# Patient Record
Sex: Female | Born: 1970 | Race: Black or African American | Hispanic: No | Marital: Single | Smoking: Never smoker
Health system: Southern US, Community
[De-identification: ages and names within clinical notes are randomized; demographics above are authoritative.]

## PROBLEM LIST (undated history)

## (undated) DIAGNOSIS — N809 Endometriosis, unspecified: Secondary | ICD-10-CM

## (undated) DIAGNOSIS — K219 Gastro-esophageal reflux disease without esophagitis: Secondary | ICD-10-CM

## (undated) DIAGNOSIS — E7800 Pure hypercholesterolemia, unspecified: Secondary | ICD-10-CM

## (undated) DIAGNOSIS — G43909 Migraine, unspecified, not intractable, without status migrainosus: Secondary | ICD-10-CM

## (undated) HISTORY — PX: OTHER SURGICAL HISTORY: SHX169

## (undated) HISTORY — DX: Pure hypercholesterolemia, unspecified: E78.00

## (undated) HISTORY — PX: TENNIS ELBOW RELEASE/NIRSCHEL PROCEDURE: SHX6651

## (undated) HISTORY — DX: Gastro-esophageal reflux disease without esophagitis: K21.9

## (undated) HISTORY — DX: Migraine, unspecified, not intractable, without status migrainosus: G43.909

## (undated) HISTORY — PX: SHOULDER SURGERY: SHX246

## (undated) HISTORY — PX: CHOLECYSTECTOMY: SHX55

## (undated) HISTORY — PX: NM RENAL LASIX (ARMC HX): HXRAD1213

## (undated) HISTORY — DX: Endometriosis, unspecified: N80.9

## (undated) HISTORY — PX: PARTIAL HYSTERECTOMY: SHX80

---

## 2016-01-17 ENCOUNTER — Other Ambulatory Visit: Payer: Self-pay | Admitting: Family Medicine

## 2016-01-17 ENCOUNTER — Ambulatory Visit
Admission: RE | Admit: 2016-01-17 | Discharge: 2016-01-17 | Disposition: A | Discharge status: Home / Self Care | Payer: BLUE CROSS/BLUE SHIELD | Source: Ambulatory Visit | Attending: Family Medicine | Admitting: Family Medicine

## 2016-01-17 DIAGNOSIS — M25561 Pain in right knee: Principal | ICD-10-CM

## 2016-01-17 DIAGNOSIS — G8929 Other chronic pain: Secondary | ICD-10-CM

## 2016-01-17 DIAGNOSIS — M545 Low back pain: Secondary | ICD-10-CM

## 2016-01-17 DIAGNOSIS — M25562 Pain in left knee: Principal | ICD-10-CM

## 2016-02-29 ENCOUNTER — Encounter (INDEPENDENT_AMBULATORY_CARE_PROVIDER_SITE_OTHER): Payer: Self-pay

## 2016-02-29 ENCOUNTER — Encounter: Payer: Self-pay | Admitting: Podiatry

## 2016-02-29 ENCOUNTER — Ambulatory Visit (INDEPENDENT_AMBULATORY_CARE_PROVIDER_SITE_OTHER): Payer: BLUE CROSS/BLUE SHIELD | Admitting: Podiatry

## 2016-02-29 ENCOUNTER — Ambulatory Visit (HOSPITAL_BASED_OUTPATIENT_CLINIC_OR_DEPARTMENT_OTHER)
Admission: RE | Admit: 2016-02-29 | Discharge: 2016-02-29 | Disposition: A | Discharge status: Home / Self Care | Payer: BLUE CROSS/BLUE SHIELD | Source: Ambulatory Visit | Attending: Podiatry | Admitting: Podiatry

## 2016-02-29 DIAGNOSIS — M722 Plantar fascial fibromatosis: Secondary | ICD-10-CM | POA: Diagnosis not present

## 2016-02-29 DIAGNOSIS — R52 Pain, unspecified: Secondary | ICD-10-CM | POA: Diagnosis not present

## 2016-02-29 DIAGNOSIS — M79671 Pain in right foot: Secondary | ICD-10-CM | POA: Diagnosis present

## 2016-02-29 DIAGNOSIS — M79672 Pain in left foot: Secondary | ICD-10-CM | POA: Diagnosis not present

## 2016-02-29 MED ORDER — METHYLPREDNISOLONE 4 MG PO TBPK: ORAL_TABLET | ORAL | 0 refills | Status: DC

## 2016-02-29 NOTE — Patient Instructions (Signed)

## 2016-02-29 NOTE — Progress Notes (Signed)
   Subjective:    Patient ID: Renee Chandler, female    DOB: [birth year], 45 y.o.   MRN: [id number]  HPI  45 year old female presents the office today for concerns of heel pain to both of her feet with a right side worse in the left and this been ongoing for 8 months. She. She saw another doctor and had 2 injections and tried bracing however her feet continue to get worse. She has pain in the morning which she first gets up and after periods of activity. It does get better with ambulation but she is on pain throughout the day as well. She denies any numbness or tingling. No swelling or redness. No recent injury or trauma. The pain does not wake her up at night. No other complaints.  Review of Systems  All other systems reviewed and are negative.      Objective:   Physical Exam General: AAO x3, NAD  Dermatological: Skin is warm, dry and supple bilateral. Nails x 10 are well manicured; remaining integument appears unremarkable at this time. There are no open sores, no preulcerative lesions, no rash or signs of infection present.  Vascular: Dorsalis Pedis artery and Posterior Tibial artery pedal pulses are 2/4 bilateral with immedate capillary fill time. There is no pain with calf compression, swelling, warmth, erythema.   Neruologic: Grossly intact via light touch bilateral. Vibratory intact via tuning fork bilateral. Protective threshold with Semmes Wienstein monofilament intact to all pedal sites bilateral.   Musculoskeletal:  Tenderness to palpation along the plantar medial tubercle of the calcaneus at the insertion of plantar fascia on the  foot. There is no pain along the course of the plantar fascia within the arch of the foot. Plantar fascia appears to be intact. There is no pain with lateral compression of the calcaneus or pain with vibratory sensation. There is no pain along the course or insertion of the achilles tendon. No other areas of tenderness to bilateral lower extremities. Muscular  strength 5/5 in all groups tested bilateral. Equinus.  Gait: Unassisted, Nonantalgic.      Assessment & Plan:  45 year old female with bilateral heel pain right worse than left. -Treatment options discussed including all alternatives, risks, and complications -Etiology of symptoms were discussed -X-rays were obtained and reviewed with the patient.  -She was told of any further steroid injections. -Discussed stretching icing exercises daily. She has not been doing this. -Discussed custom inserts. She was scanned for orthotics today. -Discussed shoe gear modifications -Prescribed medrol dose pack. Discussed side effects of the medication and directed to stop if any are to occur and call the office.  -Compound cream ordered through [facility] for anti-inflammatory. -Follow-up in 3-4 weeks or sooner if any problems arise. In the meantime, encouraged to call the office with any questions, concerns, change in symptoms.   Ovid CurdMatthew Larhonda Dettloff, DPM

## 2016-03-01 DIAGNOSIS — M722 Plantar fascial fibromatosis: Secondary | ICD-10-CM | POA: Insufficient documentation

## 2016-03-03 ENCOUNTER — Encounter: Payer: Self-pay | Admitting: Podiatry

## 2016-03-08 ENCOUNTER — Encounter: Payer: Self-pay | Admitting: Podiatry

## 2016-03-08 ENCOUNTER — Telehealth: Payer: Self-pay | Admitting: *Deleted

## 2016-03-08 MED ORDER — NONFORMULARY OR COMPOUNDED ITEM
2 refills | Status: DC
Start: 2016-03-08 — End: 2023-01-01

## 2016-03-08 NOTE — Telephone Encounter (Signed)
Pt requested status of cream Dr. Ardelle AntonWagoner ordered. I reviewed pt's record 02/29/2016, Dr. Ardelle AntonWagoner ordered [facility] Antiinflammatory cream, but rx was not given to assistant to fax. Informed pt by email the [facility] would contact with insurance information and delivery information. Faxed orders to [facility].

## 2016-03-21 ENCOUNTER — Encounter: Payer: Self-pay | Admitting: Podiatry

## 2016-03-24 ENCOUNTER — Ambulatory Visit (INDEPENDENT_AMBULATORY_CARE_PROVIDER_SITE_OTHER): Payer: BLUE CROSS/BLUE SHIELD | Admitting: Podiatry

## 2016-03-24 ENCOUNTER — Encounter: Payer: Self-pay | Admitting: Podiatry

## 2016-03-24 DIAGNOSIS — M722 Plantar fascial fibromatosis: Secondary | ICD-10-CM

## 2016-03-28 ENCOUNTER — Encounter: Payer: Self-pay | Admitting: Podiatry

## 2016-03-30 ENCOUNTER — Encounter: Payer: Self-pay | Admitting: Podiatry

## 2016-03-30 NOTE — Progress Notes (Signed)
Subjective: 45 year old female presents the office today for concerns of pain to the right heel into the arch. She said left side is doing well. She has continue with stretching, icing exercises daily. She is also continue with the plantar fascial brace and she completed a Medrol Dosepak. Her pain overall is improved however the right side does continue. She also presents a pickup orthotics. Denies any systemic complaints such as fevers, chills, nausea, vomiting. No acute changes since last appointment, and no other complaints at this time.   Objective: AAO x3, NAD DP/PT pulses palpable bilaterally, CRT less than 3 seconds There is improved but continued tenderness to palpation along the plantar medial tubercle of the calcaneus at the insertion of plantar fascia on the right foot. There is no pain to the left foot today. There is no pain along the course of the plantar fascia within the arch of the foot. Plantar fascia appears to be intact. There is no pain with lateral compression of the calcaneus or pain with vibratory sensation. There is no pain along the course or insertion of the achilles tendon. No other areas of tenderness to bilateral lower extremities. No open lesions or pre-ulcerative lesions.  No pain with calf compression, swelling, warmth, erythema  Assessment: Right foot plantar fasciitis  Plan: -All treatment options discussed with the patient including all alternatives, risks, complications.  -Patient elects to proceed with steroid injection into the right heel. Under sterile skin preparation, a total of 2.5cc of kenalog 10, 0.5% Marcaine plain, and 2% lidocaine plain were infiltrated into the symptomatic area without complication. A band-aid was applied. Patient tolerated the injection well without complication. Post-injection care with discussed with the patient. Discussed with the patient to ice the area over the next couple of days to help prevent a steroid flare.  -Orthotics were  dispensed today. Oral and written break in instructions were discussed -Continue stretching, icing exercises daily as well as supportive shoe gear. -Patient encouraged to call the office with any questions, concerns, change in symptoms.   Ovid CurdMatthew Wagoner, DPM

## 2016-04-21 ENCOUNTER — Ambulatory Visit: Payer: BLUE CROSS/BLUE SHIELD | Admitting: Podiatry

## 2016-05-22 ENCOUNTER — Ambulatory Visit (INDEPENDENT_AMBULATORY_CARE_PROVIDER_SITE_OTHER): Payer: BLUE CROSS/BLUE SHIELD | Admitting: Podiatry

## 2016-05-22 ENCOUNTER — Encounter: Payer: Self-pay | Admitting: Podiatry

## 2016-05-22 VITALS — BP 107/69 | HR 107 | Resp 16

## 2016-05-22 DIAGNOSIS — M722 Plantar fascial fibromatosis: Secondary | ICD-10-CM | POA: Diagnosis not present

## 2016-05-22 DIAGNOSIS — M629 Disorder of muscle, unspecified: Secondary | ICD-10-CM | POA: Diagnosis not present

## 2016-05-23 ENCOUNTER — Telehealth: Payer: Self-pay | Admitting: *Deleted

## 2016-05-23 DIAGNOSIS — M629 Disorder of muscle, unspecified: Secondary | ICD-10-CM

## 2016-05-23 DIAGNOSIS — M722 Plantar fascial fibromatosis: Secondary | ICD-10-CM

## 2016-05-23 NOTE — Progress Notes (Signed)
Subjective: 46 year old female presents the op city. Evaluation of right foot pain, plantar fasciitis. This is been ongoing for greater than one year and this point. She says that she still has pain on a throbbing discomfort on the bottom of the heel. She denies any numbness or tingling or any sharp pains. She states that she is continuing stretching, icing as well as when the plantar fascial brace. She is also been wearing orthotics are helping. She would like to discuss further treatment options today. Denies any systemic complaints such as fevers, chills, nausea, vomiting. No acute changes since last appointment, and no other complaints at this time.   Objective: AAO x3, NAD DP/PT pulses palpable bilaterally, CRT less than 3 seconds Negative tinel sign There is continued tenderness to palpation along the plantar medial tubercle of the calcaneus at the insertion of plantar fascia on the right foot. There is no pain along the course of the plantar fascia within the arch of the foot. Plantar fascia appears to be intact. There is no pain with lateral compression of the calcaneus or pain with vibratory sensation. There is no pain along the course or insertion of the achilles tendon. No other areas of tenderness to bilateral lower extremities. No open lesions or pre-ulcerative lesions.  No pain with calf compression, swelling, warmth, erythema  Assessment: Chronic right heel pain; plantar fasciitis.   Plan: -All treatment options discussed with the patient including all alternatives, risks, complications.  -Previous x-ray in chart were reviewed with the patient. I did send her orthotics back to help increase the medial arch height is seen for saw help. I discussed further treatment options with the patient requested plantar fasciitis. Discussed MRI, physical therapy, surgery, EPAT. After discussion of this point we will order an MRI to rule out any other pathology, plantar fascial tearing. -Follow-up  after MRI or sooner if needed. -Patient encouraged to call the office with any questions, concerns, change in symptoms.   Ovid CurdMatthew Wagoner, DPM

## 2016-05-23 NOTE — Telephone Encounter (Addendum)
-----   Message from Vivi BarrackMatthew R Wagoner, DPM sent at 05/23/2016  7:33 AM EST ----- Can you please order an MRI of the right foot to rule out plantar fascial tear. Thanks. [facility], and gave orders to [facility] [place] for [employer]. 06/02/2016-Communications email with pt's after she was hospitalized for 5 days. Pt states she is unable to afford MRI. Dr. Ardelle AntonWagoner states order PT. Informed pt by email of orders and [employer] [phone] to call for appt.

## 2016-05-24 ENCOUNTER — Telehealth: Payer: Self-pay | Admitting: *Deleted

## 2016-05-24 NOTE — Telephone Encounter (Signed)
Sent the inserts back to [facility] to increase the arch areas. Misty StanleyLisa

## 2016-05-26 ENCOUNTER — Telehealth: Payer: Self-pay | Admitting: *Deleted

## 2016-05-26 NOTE — Telephone Encounter (Signed)
"  Her MRI w/out contrast right foot requires authorization from [employer]."

## 2016-05-26 NOTE — Telephone Encounter (Signed)
I called and informed Renee Chandler that MRI was authorized for 05/29/2016.  The authorization number is [id number] and it is valid from 05/26/2016 to 06/24/2016.

## 2016-05-29 ENCOUNTER — Other Ambulatory Visit: Payer: BLUE CROSS/BLUE SHIELD

## 2016-06-01 ENCOUNTER — Encounter: Payer: Self-pay | Admitting: Podiatry

## 2016-06-02 ENCOUNTER — Other Ambulatory Visit: Payer: BLUE CROSS/BLUE SHIELD

## 2016-06-14 ENCOUNTER — Encounter: Payer: Self-pay | Admitting: Podiatry

## 2016-06-16 ENCOUNTER — Ambulatory Visit: Payer: BLUE CROSS/BLUE SHIELD

## 2016-06-16 DIAGNOSIS — M722 Plantar fascial fibromatosis: Secondary | ICD-10-CM

## 2016-06-16 NOTE — Patient Instructions (Signed)

## 2016-06-19 NOTE — Progress Notes (Signed)
Patient presents for orthotic pick up.  Verbal and written break in and wear instructions given.  Patient will follow up in 4 weeks if symptoms worsen or fail to improve. 

## 2017-03-08 ENCOUNTER — Ambulatory Visit (INDEPENDENT_AMBULATORY_CARE_PROVIDER_SITE_OTHER): Payer: BLUE CROSS/BLUE SHIELD | Admitting: Podiatry

## 2017-03-08 ENCOUNTER — Encounter: Payer: Self-pay | Admitting: Podiatry

## 2017-03-08 DIAGNOSIS — M722 Plantar fascial fibromatosis: Secondary | ICD-10-CM | POA: Diagnosis not present

## 2017-03-08 DIAGNOSIS — M629 Disorder of muscle, unspecified: Secondary | ICD-10-CM

## 2017-03-11 NOTE — Progress Notes (Signed)
Subjective: Ms. Durwin NoraDixon presents the office today for follow-up evaluation of bilateral heel pain with left side worse than the right.  This is been ongoing at this point for over a year and I have seen her for some time for this.  She has continued numerous conservative treatments including shoe changes, offloading, padding, inserts without any significant improvement.  At this point like to go ahead and get an MRI done for possible plan for surgical intervention or other treatments.  She denies any numbness or tingling.  The pain does not wake her up at night.  She has no other concerns. Denies any systemic complaints such as fevers, chills, nausea, vomiting. No acute changes since last appointment, and no other complaints at this time.   Objective: AAO x3, NAD DP/PT pulses palpable bilaterally, CRT less than 3 seconds Negative Tinel's sign bilaterally Tenderness to palpation along the plantar medial tubercle of the calcaneus at the insertion of plantar fascia on the left greater than right foot. There is no pain along the course of the plantar fascia within the arch of the foot. Plantar fascia appears to be intact. There is no pain with lateral compression of the calcaneus or pain with vibratory sensation. There is no pain along the course or insertion of the achilles tendon. No other areas of tenderness to bilateral lower extremities. No open lesions or pre-ulcerative lesions.  No pain with calf compression, swelling, warmth, erythema  Assessment: Bilateral heel pain left side worse than right which is chronic  Plan: -All treatment options discussed with the patient including all alternatives, risks, complications.  -At this point we discussed numerous conservative and surgical treatment options.  We will plan on getting an MRI to rule out a plantar fascial tear or other pathology was for potential surgical planning.  MRI of bilateral feet were ordered.  For now continue conservative treatment but  she wished to hold off on injections at this point. -Patient encouraged to call the office with any questions, concerns, change in symptoms.    Vivi BarrackMatthew R Wagoner DPM

## 2017-03-12 ENCOUNTER — Telehealth: Payer: Self-pay | Admitting: *Deleted

## 2017-03-12 DIAGNOSIS — M629 Disorder of muscle, unspecified: Secondary | ICD-10-CM

## 2017-03-12 DIAGNOSIS — M722 Plantar fascial fibromatosis: Secondary | ICD-10-CM

## 2017-03-12 NOTE — Telephone Encounter (Signed)
Orders to J. Quintana, RN for pre-cert and faxed to [facility]. 

## 2017-03-12 NOTE — Telephone Encounter (Signed)
-----   Message from Vivi BarrackMatthew R Wagoner, DPM sent at 03/11/2017  9:36 AM EST ----- Please order bilateral MRIs sharp plantar fascial tear?  Thank you

## 2017-03-20 ENCOUNTER — Telehealth: Payer: Self-pay | Admitting: *Deleted

## 2017-03-20 NOTE — Telephone Encounter (Addendum)
Pt states she was to be schedule for MRIs, but no one has called. Left message informing pt of [facility] [phone] to schedule at her convenience.

## 2017-03-21 ENCOUNTER — Encounter: Payer: Self-pay | Admitting: Podiatry

## 2017-03-21 ENCOUNTER — Telehealth: Payer: Self-pay | Admitting: Podiatry

## 2017-03-21 DIAGNOSIS — M722 Plantar fascial fibromatosis: Secondary | ICD-10-CM

## 2017-03-21 DIAGNOSIS — M629 Disorder of muscle, unspecified: Secondary | ICD-10-CM

## 2017-03-21 NOTE — Telephone Encounter (Signed)
[facility] me a message yesterday in regards to an MRI being scheduled. I need to speak to her because it was sent to the wrong radiology place because [place]. When I saw Dr. Ardelle AntonWagoner he stated he would have it scheduled somewhere in [place] because its closer for me and he would still be able to see it online. If someone would please call me back and I would greatly appreciate it. My number is [phone]. Thank you.

## 2017-03-21 NOTE — Telephone Encounter (Signed)
I called [employer] to check status of PEER TO PEER and was transferred to [facility] [facility] and was told the case was closed, but I could leave a message to receive a call for Provider Courtesy Review. I asked for the phone number to call and was told there was not phone number, that I should leave a message to have someone call back. I listened to the message and was also given a fax [phone] to fax clinicals ATTN:  Appeals. I spoke with Tomasa HoseJ. Quintana, RN, she stated send fax information and get information for DR Ardelle AntonWagoner to speak. with Provider Courtesy Review. Faxed Cover Sheet with ATTN:  Appeals, clinicals, orders and demographics [phone].

## 2017-03-21 NOTE — Telephone Encounter (Signed)
I spoke with pt and informed her that the order would be changed once the PEER TO PEER had been completed. Pt asked what a PEER TO PEER was and I explained that it was went Dr.Wagoner spoke with a insurance company doctor to discuss the need for the MRI. Pt asked how long it would take to get a PEER TO PEER, she wanted to have the MRIs prior to the end of the year. Pt asked why [facility] had called her to schedule and I told her she could schedule before it was prior authorized. Pt stated I was not understanding what she was asking and then asked if it took 2 weeks to get the PEER TO PEER, like it had been. I told pt that the we had Tomasa HoseJ. Quintana, RN working on it and we had the long weekend for Thanksgiving, so it did not always take so long. Pt stated I was not understanding what she was saying and kept "overtalking" her, so she would just wait until the PEER TO PEER. I told pt to ask again that I was listening and she hung up.

## 2017-03-22 NOTE — Telephone Encounter (Signed)
[employer] - Renee Chandler Provider Courtesy Review has been started and can not be stopped, her information says the determination will be made today, if it denied another case/orders can not be started within 60 days.

## 2017-03-22 NOTE — Telephone Encounter (Signed)
Dr. Ardelle AntonWagoner states cancel the orders of 03/11/2017 and [facility]order at [facility]. Orders to Tomasa HoseJ. Quintana, RN and [facility].

## 2017-03-22 NOTE — Addendum Note (Signed)
Addended by: Alphia Kava'CONNELL, Lynette Topete D on: 03/22/2017 10:10 AM   Modules accepted: Orders

## 2017-03-23 ENCOUNTER — Telehealth: Payer: Self-pay | Admitting: *Deleted

## 2017-03-23 NOTE — Telephone Encounter (Signed)
"  MRI was not approved after reading patient's clinical notes.  AIM review process has been completed.  This case cannot be re-opened.  If the doctor wants to start another case it has to be after 60 days.  The patient can appeal if she likes within the 60 day timeframe.  An official letter will be sent to the patient as well as the doctor.  Give me a call if you have any questions."

## 2017-03-23 NOTE — Telephone Encounter (Signed)
[employer] Renee Chandler states 10:30am 03/22/2017 MRI [id number] for right and left feet were denied due to did not meet the criteria. I asked [employer] Renee Chandler to fax the statements to [phone].

## 2017-03-23 NOTE — Telephone Encounter (Signed)
Please let her know and to start the appeal if she wishes.

## 2017-03-23 NOTE — Telephone Encounter (Signed)
What is the criteria that is not met?

## 2017-03-26 ENCOUNTER — Encounter: Payer: Self-pay | Admitting: Podiatry

## 2017-03-26 NOTE — Telephone Encounter (Signed)
Spoke with patient informing her of the denial of her MRI and reasons why. Informed her of her ability to appeal and offered information on how to do an appeal. She stated that she has this information already and that she would write an appeal letter and file with A[employer]. I told her if she needed anything from our office to let us know.

## 2017-04-04 ENCOUNTER — Encounter: Payer: Self-pay | Admitting: Podiatry

## 2017-04-09 ENCOUNTER — Telehealth: Payer: Self-pay | Admitting: *Deleted

## 2017-04-09 DIAGNOSIS — M79672 Pain in left foot: Secondary | ICD-10-CM

## 2017-04-09 NOTE — Telephone Encounter (Signed)
Dr. Ardelle AntonWagoner ordered [facility].

## 2017-04-09 NOTE — Telephone Encounter (Addendum)
[employer] - [place] APPROVED MRI LEFT [id number] HEEL/FOOT ORDER# [id number], VALID 04/09/2017 - 01/152019. [facility] to [facility]. Emailed pt MRI of Heel/Foot has been approved for left.

## 2017-04-12 NOTE — Telephone Encounter (Signed)
Entered in error

## 2017-04-20 ENCOUNTER — Ambulatory Visit
Admission: RE | Admit: 2017-04-20 | Discharge: 2017-04-20 | Disposition: A | Discharge status: Home / Self Care | Payer: BLUE CROSS/BLUE SHIELD | Source: Ambulatory Visit | Attending: Podiatry | Admitting: Podiatry

## 2017-04-20 ENCOUNTER — Encounter: Payer: Self-pay | Admitting: Podiatry

## 2017-04-20 DIAGNOSIS — R600 Localized edema: Secondary | ICD-10-CM | POA: Insufficient documentation

## 2017-04-20 DIAGNOSIS — M79672 Pain in left foot: Secondary | ICD-10-CM | POA: Diagnosis present

## 2017-04-23 ENCOUNTER — Telehealth: Payer: Self-pay | Admitting: *Deleted

## 2017-04-23 DIAGNOSIS — M722 Plantar fascial fibromatosis: Secondary | ICD-10-CM

## 2017-04-23 NOTE — Telephone Encounter (Signed)
Left message informing pt I would call on her alternate number to discuss the treatment options recommended by Dr. Ardelle AntonWagoner. I informed pt of Dr. Gabriel RungWagoner's recommendation for EPAT $600.00 x 4 visits, 1 weekly x3 weeks then one visit 30 days later, payment plan $150.00/visit, and PT or office visit to discuss other options to include injections. Pt states she would like the EPAT, but it is too expensive at this time, so would like to try [facility] at [facility]. Robet Leu GOOGLED [facility] [address], [place], [place] [address], [phone]. I called [facility] and they will fax the referral form.

## 2017-04-25 NOTE — Telephone Encounter (Signed)
Faxed required form to [facility] for evaluation and treatment of B/L plantar fasciitis.

## 2017-04-25 NOTE — Telephone Encounter (Signed)
Thanks

## 2018-09-15 IMAGING — MR MR HEEL *L* W/O CM
6 series · 40 of 40 positions shown · non-contrast
Comparison: radiographs dated 02/29/2016

CLINICAL DATA: Chronic progressive left foot pain.

EXAM:
MR OF THE LEFT HEEL WITHOUT CONTRAST
TECHNIQUE: Multiplanar, multisequence MR imaging of the left hindfoot was
performed. No intravenous contrast was administered.

[Series 3: PD fat-sat · axial · 3.0mm · 0.56mm/px · z∈[-44,+60]mm · 9 of 30 slices shown]
[im 1/30]
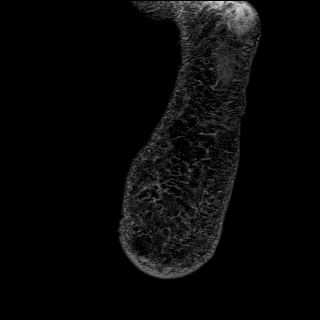
[im 4/30]
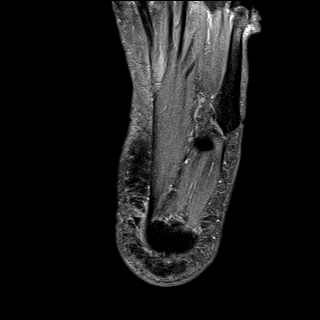
[im 8/30]
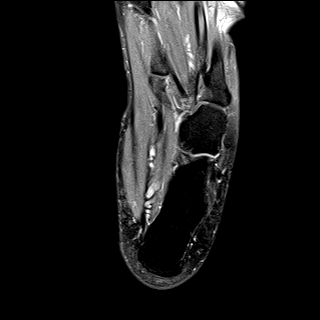
[im 11/30]
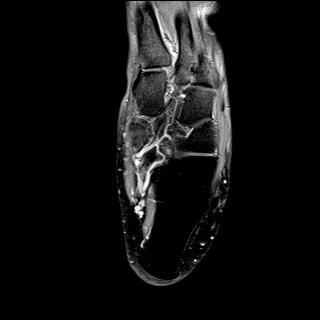
[im 15/30]
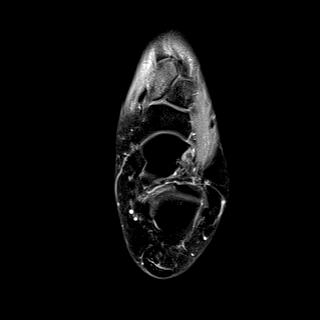
[im 19/30]
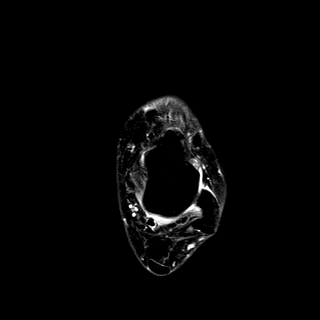
[im 22/30]
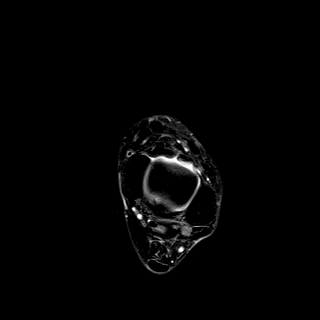
[im 26/30]
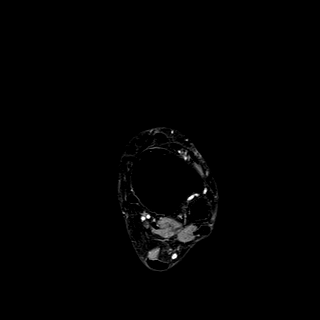
[im 30/30]
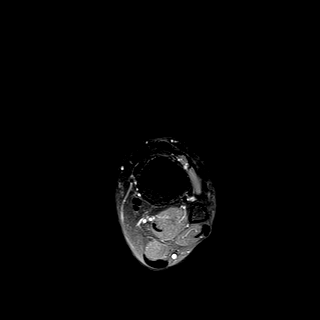

[Series 4: T2 fat-sat · axial · 3.0mm · 0.56mm/px · z∈[-44,+60]mm · 8 of 30 slices shown (1 of 3)]
[im 1/30]
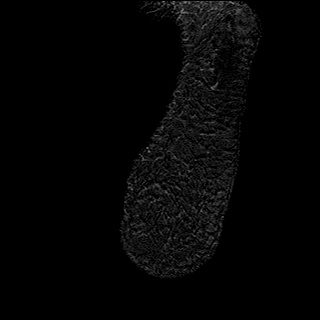
[im 5/30]
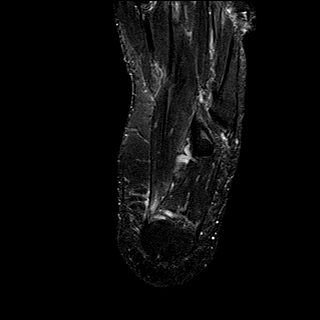
[im 9/30]
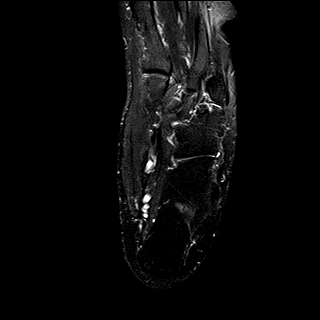
[im 13/30]
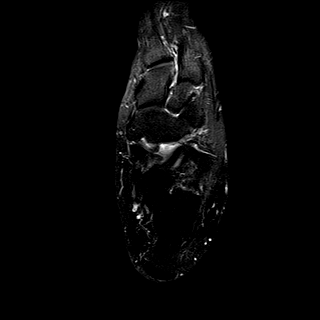
[im 17/30]
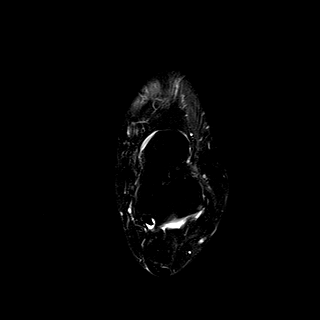
[im 21/30]
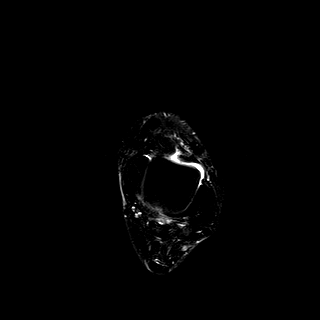
[im 25/30]
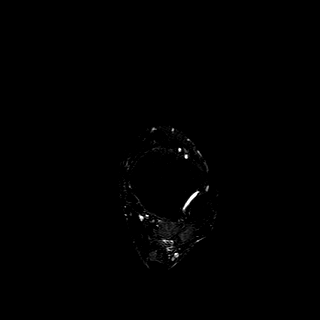
[im 30/30]
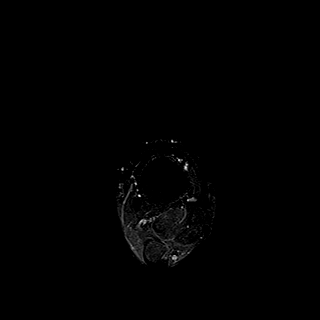

[Series 5: T2 fat-sat · coronal · 3.0mm · 0.56mm/px · 8 of 31 slices shown (2 of 3)]
[im 1/31]
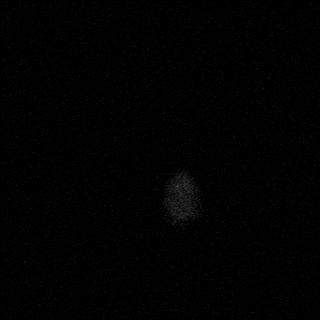
[im 5/31]
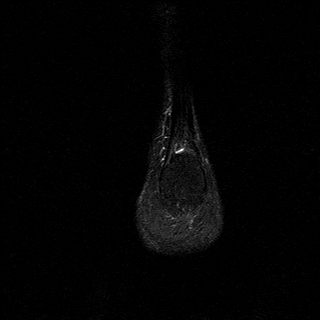
[im 9/31]
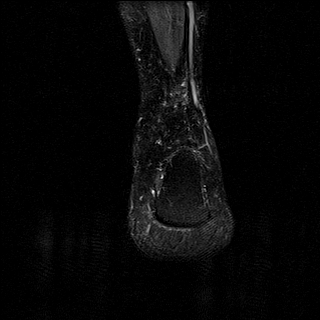
[im 13/31]
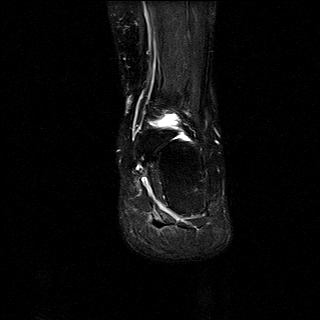
[im 18/31]
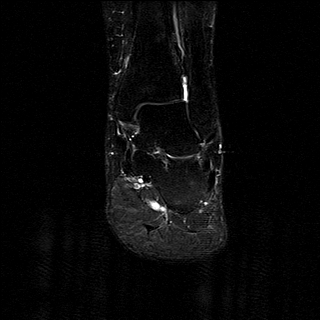
[im 22/31]
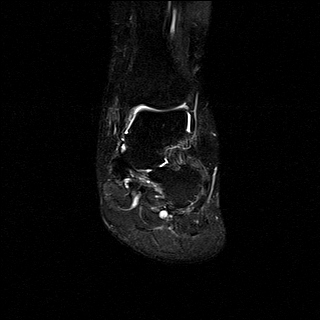
[im 26/31]
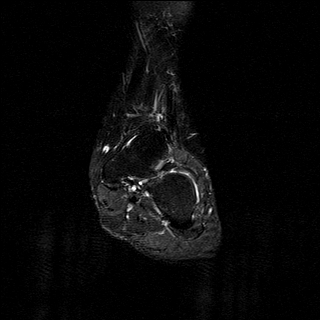
[im 31/31]
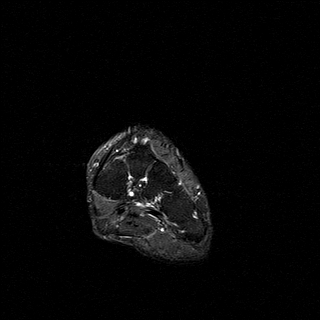

[Series 6: T1 · sagittal · 3.0mm · 0.56mm/px · 5 of 21 slices shown]
[im 1/21]
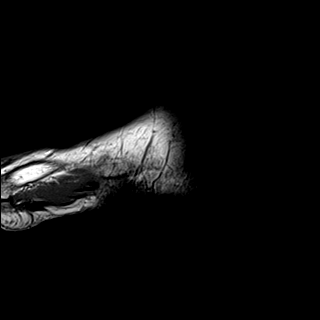
[im 6/21]
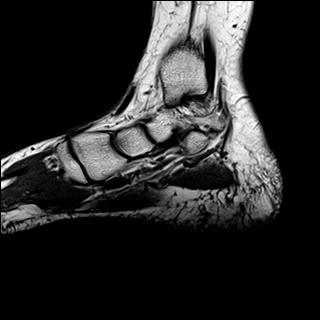
[im 11/21]
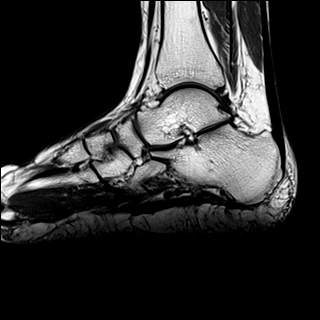
[im 16/21]
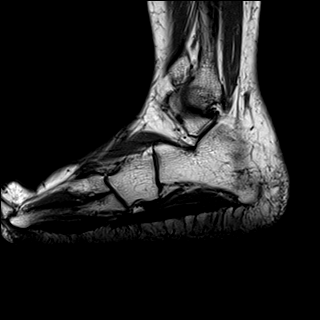
[im 21/21]
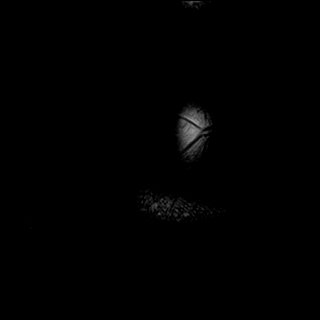

[Series 7: T2 fat-sat · sagittal · 3.0mm · 0.70mm/px · 5 of 21 slices shown (3 of 3)]
[im 1/21]
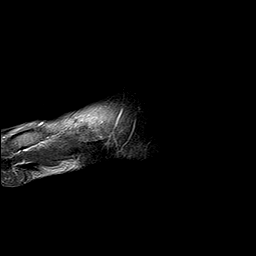
[im 6/21]
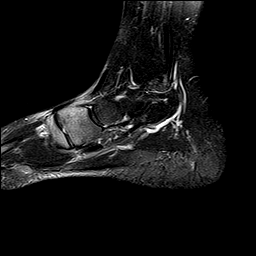
[im 11/21]
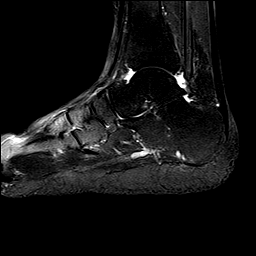
[im 16/21]
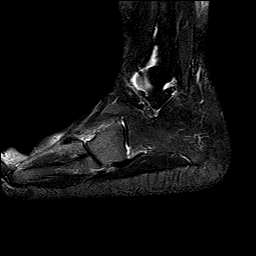
[im 21/21]
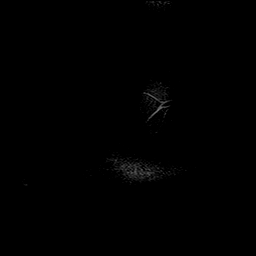

[Series 8: STIR · sagittal · 3.0mm · 0.35mm/px · 5 of 21 slices shown]
[im 1/21]
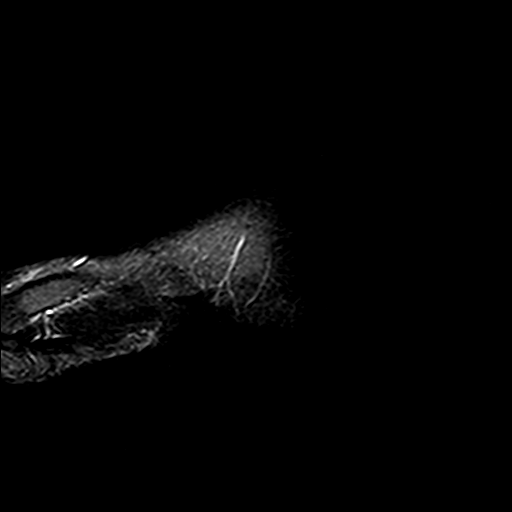
[im 6/21]
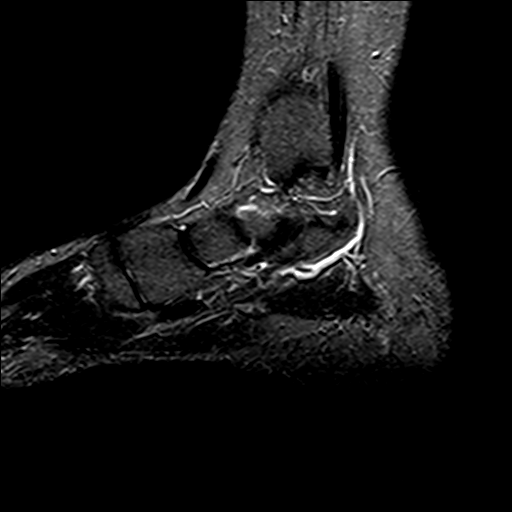
[im 11/21]
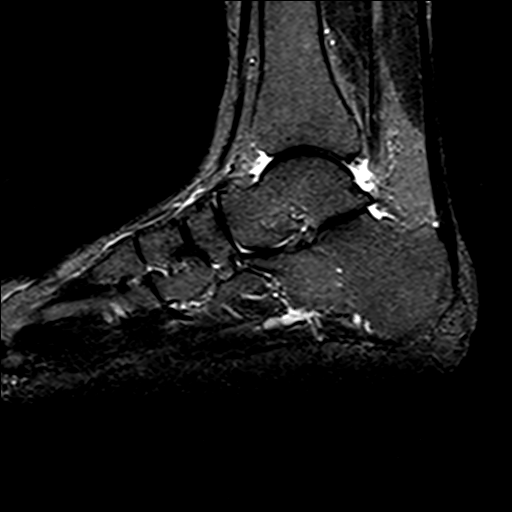
[im 16/21]
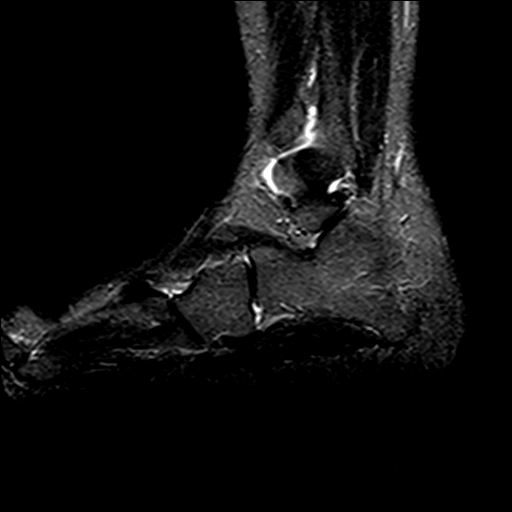
[im 21/21]
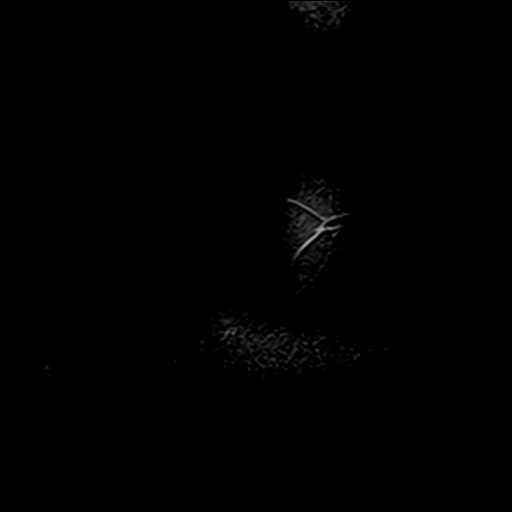

[40 of 40 positions shown; findings below may reference images not displayed]

FINDINGS: TENDONS

Peroneal: Normal.

Posteromedial: Normal.

Anterior: Normal.

Achilles: Normal.

Plantar Fascia: There is a small focal area of edema around the
proximal medial band of the plantar fascia including slight edema in
the proximal flexor digitorum brevis muscle. There is no hypertrophy
of the medial band. There is no edema in the adjacent calcaneus.
There is no distal muscle edema.

LIGAMENTS

Lateral: Normal.

Medial: Normal.

CARTILAGE

Ankle Joint: Normal.

Subtalar Joints/Sinus Tarsi: Normal.

Bones: Normal.

Other: None
IMPRESSION: Small focal area of edema surrounding the proximal medial band of
the plantar fascia including slight edema in the origin of the
flexor digitorum brevis muscle. Findings are consistent with focal
plantar fasciitis.

## 2023-01-01 ENCOUNTER — Encounter: Payer: Self-pay | Admitting: Psychiatry

## 2023-01-01 ENCOUNTER — Ambulatory Visit: Payer: BC Managed Care – PPO | Admitting: Psychiatry

## 2023-01-01 VITALS — BP 126/64 | Ht 64.0 in | Wt 169.0 lb

## 2023-01-01 DIAGNOSIS — G43E19 Chronic migraine with aura, intractable, without status migrainosus: Secondary | ICD-10-CM | POA: Diagnosis not present

## 2023-01-01 MED ORDER — EMGALITY 120 MG/ML ~~LOC~~ SOAJ: 1.0000 | SUBCUTANEOUS | 3 refills | Status: DC

## 2023-01-01 MED ORDER — EMGALITY 120 MG/ML ~~LOC~~ SOAJ: 2.0000 | Freq: Once | SUBCUTANEOUS | 0 refills | Status: AC

## 2023-01-01 MED ORDER — RIZATRIPTAN BENZOATE 10 MG PO TABS: 10.0000 mg | ORAL_TABLET | ORAL | 6 refills | Status: AC | PRN

## 2023-01-01 NOTE — Progress Notes (Signed)
Referring:  [name], FNP [address] [place],  [place] [address]  PCP: Pcp, No  Neurology was asked to evaluate Renee Chandler, a 52 year old female for a chief complaint of headaches.  Our recommendations of care will be communicated by shared medical record.    CC:  headaches  History provided from self  HPI:  Medical co-morbidities: s/p gastric bypass  The patient presents for evaluation of headaches which began when she was a teenager. Migraines are associated with photophobia and nausea. She is currently averaging 10-15 migraines per month. Takes Excedrin as needed for her more mild migraines and the rizatriptan for her more severe migraines. Maxalt makes her feel a "numbing sensation" when she takes it, but she does not find that particularly bothersome. Takes Verapamil 180 mg daily and Ajovy 225 monthly for migraine prevention. This combination had worked relatively well until about 1.5 months ago when it started to lose efficacy.  Headache History: Onset: 1983 Triggers: heat, fasting, strong smells Aura: spots in vision Quality/Description: throbbing Associated Symptoms:  Photophobia: yes  Phonophobia: no  Nausea: sometimes Worse with activity?: yes Duration of headaches:  Migraine days per month: 15 Headache free days per month: 15  Current Treatment: Abortive Maxalt 10 mg PRN  Preventative Ajovy 225 mg monthly Verapamil 180 mg daily  Prior Therapies                                 Prevention: Verapamil 180 mg daily Propranolol Topamax/Trokendi Gabapentin Amitriptyline Cymbalta 20 mg every other day Ajovy Botox  Rescue: Maxalt 10 mg PRN Zomig Imitrex Relpax Axert Naratriptan Ubrelvy   LABS: N/a  IMAGING:  MRI brain several years ago reportedly normal per patient   Current Outpatient Medications on File Prior to Visit  Medication Sig Dispense Refill   Docusate Sodium (DSS) 100 MG CAPS Take 1 capsule by mouth  daily.     verapamil (CALAN-SR) 180 MG CR tablet Take by mouth.     zolpidem (AMBIEN) 10 MG tablet TAKE 1 TABLET BY MOUTH AT BEDTIME AS NEEDED FOR SLEEP     Cholecalciferol (VITAMIN D-1000 MAX ST) 1000 units tablet Take by mouth.     Cholecalciferol (VITAMIN D3) 1000 units CAPS Take by mouth.     Cholecalciferol (VITAMIN D3) 5000 units TABS Take by mouth. (Patient not taking: Reported on 01/01/2023)     diclofenac sodium (VOLTAREN) 1 % GEL Place onto the skin.     Magnesium Oxide 400 MG CAPS Take by mouth.     MAGNESIUM PO Take by mouth. (Patient not taking: Reported on 01/01/2023)     meclizine (ANTIVERT) 25 MG tablet Take by mouth.     methylPREDNISolone (MEDROL DOSEPAK) 4 MG TBPK tablet Take as directed 21 tablet 0   nabumetone (RELAFEN) 500 MG tablet Take 500 mg by mouth.     NONFORMULARY OR COMPOUNDED ITEM [facility]:  Antiinflammatory cream - Diclofenac 3%, Baclofen 2%, Lidocaine 2%, apply 1-2 grams to affected area 3-4 times daily. 120 each 2   omeprazole (PRILOSEC OTC) 20 MG tablet Take by mouth.     omeprazole (PRILOSEC) 20 MG capsule      ondansetron (ZOFRAN) 4 MG tablet Take by mouth.     sertraline (ZOLOFT) 25 MG tablet Take 25 mg by mouth.     triamcinolone cream (KENALOG) 0.1 % Apply topically.     verapamil (CALAN-SR) 240  MG CR tablet Take 240 mg by mouth.     No current facility-administered medications on file prior to visit.     Allergies: Allergies  Allergen Reactions   Penicillin G Hives   Penicillins Hives    Hives as a child    Family History: History reviewed. No pertinent family history.   Past Medical History: Past Medical History:  Diagnosis Date   Acid reflux    Endometriosis    High cholesterol    Migraines     Past Surgical History Past Surgical History:  Procedure Laterality Date   CHOLECYSTECTOMY     gastrix bypass Bilateral    NM RENAL LASIX ([facility] HX)     PARTIAL HYSTERECTOMY     SHOULDER SURGERY Bilateral    TENNIS ELBOW  RELEASE/NIRSCHEL PROCEDURE      Social History: Social History   Tobacco Use   Smoking status: Never   Smokeless tobacco: Never  Vaping Use   Vaping status: Never Used  Substance Use Topics   Alcohol use: Yes    Comment: occasionally   Drug use: Never    ROS: Negative for fevers, chills. Positive for headaches. All other systems reviewed and negative unless stated otherwise in HPI.   Physical Exam:   Vital Signs: BP 126/64 (BP Location: Right Arm, Patient Position: Sitting)   Ht 5\' 4"  (1.626 m)   Wt 169 lb (76.7 kg)   BMI 29.01 kg/m  GENERAL: well appearing,in no acute distress,alert SKIN:  Color, texture, turgor normal. No rashes or lesions HEAD:  Normocephalic/atraumatic. CV:  RRR RESP: Normal respiratory effort  NEUROLOGICAL: Mental Status: Alert, oriented to person, place and time,Follows commands Cranial Nerves: PERRL, visual fields intact to confrontation, extraocular movements intact, facial sensation intact, no facial droop or ptosis, hearing grossly intact, no dysarthria Motor: muscle strength 5/5 both upper and lower extremities,no drift, normal tone Reflexes: 2+ throughout Sensation: intact to light touch all 4 extremities Coordination: Finger-to- nose-finger intact bilaterally Gait: normal-based   IMPRESSION: 52 year old female with a history of gastric bypass who presents for evaluation of chronic migraines. Neurological exam today is unremarkable. Will continue verapamil for now and switch Ajovy to [facility] for migraine prevention. May consider Qulipta in the future if no improvement with Emgality. Will continue Maxalt for rescue.  PLAN: -Prevention: Start Emgality 120 mg monthly, continue verapamil 180 mg daily for now -Rescue: Continue Maxalt 10 mg PRN   I spent a total of 21 minutes chart reviewing and counseling the patient. Headache education was done. Discussed treatment options including preventive and acute medications. Discussed medication  side effects, adverse reactions and drug interactions. Written educational materials and patient instructions outlining all of the above were given.  Follow-up: 5 months   Ocie Doyne, MD 01/01/2023   2:51 PM

## 2023-01-16 ENCOUNTER — Telehealth: Payer: Self-pay | Admitting: Psychiatry

## 2023-01-16 NOTE — Telephone Encounter (Signed)
Pulled voicemail from patient from 2:25PM. Stated [facility] has informed her they have been faxing over paperwork for 2 weeks to get the prior authorization for her injections and they have not received anything back from [place]. She is calling us to see what needs to be done. Her call back number is [phone].

## 2023-01-16 NOTE — Telephone Encounter (Signed)
Pt called stating she has talked with her insurance company and they are waitiing on the prior auth for [employer]. She would like a call back on the status. Best call back is [phone].

## 2023-01-17 ENCOUNTER — Telehealth: Payer: Self-pay

## 2023-01-17 ENCOUNTER — Other Ambulatory Visit (HOSPITAL_COMMUNITY): Payer: Self-pay

## 2023-01-17 NOTE — Telephone Encounter (Signed)
PA request has been Submitted. New Encounter created for follow up. For additional info see Pharmacy Prior Auth telephone encounter from 01/17/2023.

## 2023-01-17 NOTE — Telephone Encounter (Signed)
Pharmacy Patient Advocate Encounter   Received notification from Physician's Office that prior authorization for Emgality 120MG /ML auto-injectors (migraine) is required/requested.   Insurance verification completed.   The patient is insured through [employer] .   Per test claim: PA required; PA started via CoverMyMeds. KEY [id number] . Waiting for clinical questions to populate.

## 2023-01-17 NOTE — Telephone Encounter (Signed)
Clinical questions have been submitted-awaiting determination. 

## 2023-01-17 NOTE — Telephone Encounter (Signed)
PA is needed for Emgality injection.

## 2023-01-19 ENCOUNTER — Other Ambulatory Visit (HOSPITAL_COMMUNITY): Payer: Self-pay

## 2023-01-19 NOTE — Telephone Encounter (Signed)
Pharmacy Patient Advocate Encounter  Received notification from [facility] that Prior Authorization for Emgality 120MG /ML auto-injectors (migraine) has been APPROVED from 01/17/23 to 04/11/23   PA #/Case ID/Reference #: [id number]

## 2023-01-22 ENCOUNTER — Encounter: Payer: Self-pay | Admitting: *Deleted

## 2023-01-22 NOTE — Telephone Encounter (Signed)
Sent mychart message

## 2023-05-01 ENCOUNTER — Encounter: Payer: Self-pay | Admitting: Neurology

## 2023-05-02 ENCOUNTER — Telehealth: Payer: Self-pay | Admitting: Pharmacy Technician

## 2023-05-02 ENCOUNTER — Other Ambulatory Visit (HOSPITAL_COMMUNITY): Payer: Self-pay

## 2023-05-02 NOTE — Telephone Encounter (Signed)
 Pharmacy Patient Advocate Encounter   Received notification from CoverMyMeds that prior authorization for Emgality  120MG /ML auto-injectors (migraine) is required/requested.   Insurance verification completed.   The patient is insured through [facility] .   Per test claim: PA required; PA submitted to above mentioned insurance via CoverMyMeds Key/confirmation #/EOC [id number] Status is pending

## 2023-05-03 ENCOUNTER — Other Ambulatory Visit (HOSPITAL_COMMUNITY): Payer: Self-pay

## 2023-05-03 MED ORDER — EMGALITY 120 MG/ML ~~LOC~~ SOAJ: 1.0000 | SUBCUTANEOUS | 1 refills | Status: DC

## 2023-05-03 MED ORDER — EMGALITY 120 MG/ML ~~LOC~~ SOAJ: 1.0000 | SUBCUTANEOUS | 1 refills | Status: AC

## 2023-05-03 NOTE — Telephone Encounter (Signed)
 Okay to refill Emgality.

## 2023-05-03 NOTE — Telephone Encounter (Signed)
 Okd x 2.

## 2023-05-03 NOTE — Addendum Note (Signed)
 Addended by: Guy Begin on: 05/03/2023 04:01 PM   Modules accepted: Orders

## 2023-05-03 NOTE — Telephone Encounter (Signed)
 Pharmacy Patient Advocate Encounter  Received notification from [facility] that Prior Authorization for Emgality  120MG /ML auto-injectors (migraine) has been APPROVED from 05/02/2023 to 05/01/2024. Ran test claim, Copay is $35.00. This test claim was processed through [facility]- copay amounts may vary at other pharmacies due to pharmacy/plan contracts, or as the patient moves through the different stages of their insurance plan.   PA #/Case ID/Reference #: N/A

## 2023-06-05 ENCOUNTER — Ambulatory Visit: Payer: BC Managed Care – PPO | Admitting: Neurology
